# Patient Record
Sex: Male | Born: 1965 | Hispanic: No | Marital: Married | Smoking: Never smoker
Health system: Southern US, Community
[De-identification: ages and names within clinical notes are randomized; demographics above are authoritative.]

## PROBLEM LIST (undated history)

## (undated) DIAGNOSIS — E0500 Thyrotoxicosis with diffuse goiter without thyrotoxic crisis or storm: Secondary | ICD-10-CM

## (undated) HISTORY — PX: GALLBLADDER SURGERY: SHX652

## (undated) HISTORY — PX: TONSILLECTOMY: SUR1361

---

## 2004-05-09 ENCOUNTER — Inpatient Hospital Stay (HOSPITAL_COMMUNITY): Admission: EM | Admit: 2004-05-09 | Discharge: 2004-05-13 | Payer: Self-pay | Admitting: Emergency Medicine
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
## 2016-04-21 ENCOUNTER — Emergency Department (HOSPITAL_COMMUNITY)
Admission: EM | Admit: 2016-04-21 | Discharge: 2016-04-21 | Disposition: A | Discharge status: Home / Self Care | Payer: 59 | Attending: Emergency Medicine | Admitting: Emergency Medicine

## 2016-04-21 ENCOUNTER — Encounter (HOSPITAL_COMMUNITY): Payer: Self-pay

## 2016-04-21 DIAGNOSIS — S0031XA Abrasion of nose, initial encounter: Secondary | ICD-10-CM | POA: Diagnosis not present

## 2016-04-21 DIAGNOSIS — Z23 Encounter for immunization: Secondary | ICD-10-CM | POA: Diagnosis not present

## 2016-04-21 DIAGNOSIS — Y9239 Other specified sports and athletic area as the place of occurrence of the external cause: Secondary | ICD-10-CM | POA: Insufficient documentation

## 2016-04-21 DIAGNOSIS — Y999 Unspecified external cause status: Secondary | ICD-10-CM | POA: Insufficient documentation

## 2016-04-21 DIAGNOSIS — Y939 Activity, unspecified: Secondary | ICD-10-CM | POA: Diagnosis not present

## 2016-04-21 DIAGNOSIS — W228XXA Striking against or struck by other objects, initial encounter: Secondary | ICD-10-CM | POA: Insufficient documentation

## 2016-04-21 DIAGNOSIS — R55 Syncope and collapse: Secondary | ICD-10-CM | POA: Diagnosis not present

## 2016-04-21 DIAGNOSIS — S0081XA Abrasion of other part of head, initial encounter: Secondary | ICD-10-CM | POA: Diagnosis present

## 2016-04-21 HISTORY — DX: Thyrotoxicosis with diffuse goiter without thyrotoxic crisis or storm: E05.00

## 2016-04-21 LAB — BASIC METABOLIC PANEL
Anion gap: 5 (ref 5–15)
BUN: 18 mg/dL (ref 6–20)
CO2: 27 mmol/L (ref 22–32)
Calcium: 9.1 mg/dL (ref 8.9–10.3)
Chloride: 110 mmol/L (ref 101–111)
Creatinine, Ser: 1.3 mg/dL — ABNORMAL HIGH (ref 0.61–1.24)
GFR calc Af Amer: >60 mL/min (ref >60)
GFR calc non Af Amer: >60 mL/min (ref >60)
Glucose, Bld: 97 mg/dL (ref 65–99)
Potassium: 3.8 mmol/L (ref 3.5–5.1)
Sodium: 142 mmol/L (ref 135–145)

## 2016-04-21 LAB — CBC
HCT: 45.1 % (ref 39.0–52.0)
Hemoglobin: 16.1 g/dL (ref 13.0–17.0)
MCH: 29.6 pg (ref 26.0–34.0)
MCHC: 35.7 g/dL (ref 30.0–36.0)
MCV: 82.9 fL (ref 78.0–100.0)
Platelets: 215 10*3/uL (ref 150–400)
RBC: 5.44 MIL/uL (ref 4.22–5.81)
RDW: 12.3 % (ref 11.5–15.5)
WBC: 21.4 10*3/uL — ABNORMAL HIGH (ref 4.0–10.5)

## 2016-04-21 LAB — URINALYSIS, ROUTINE W REFLEX MICROSCOPIC
Bilirubin Urine: NEGATIVE
Glucose, UA: NEGATIVE mg/dL
Hgb urine dipstick: NEGATIVE
Ketones, ur: 20 mg/dL — AB
Leukocytes, UA: NEGATIVE
Nitrite: NEGATIVE
Protein, ur: NEGATIVE mg/dL
Specific Gravity, Urine: 1.014 (ref 1.005–1.030)
pH: 5 (ref 5.0–8.0)

## 2016-04-21 LAB — CBG MONITORING, ED: Glucose-Capillary: 95 mg/dL (ref 65–99)

## 2016-04-21 MED ORDER — SODIUM CHLORIDE 0.9 % IV BOLUS (SEPSIS)
1000.0000 mL | Freq: Once | INTRAVENOUS | Status: AC
  Administered 2016-04-21: 1000 mL via INTRAVENOUS

## 2016-04-21 MED ORDER — TETANUS-DIPHTH-ACELL PERTUSSIS 5-2.5-18.5 LF-MCG/0.5 IM SUSP
0.5000 mL | Freq: Once | INTRAMUSCULAR | Status: AC
  Administered 2016-04-21: 0.5 mL via INTRAMUSCULAR
  Filled 2016-04-21: qty 0.5
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
## 2016-04-21 NOTE — ED Triage Notes (Signed)
Patient states during a circuit training at the gym patient felt light headed and dizzy.  Patient sat on the stationary bike to rest.  Patient then "passed out".  Patient denies experiencing syncope prior to this event.  Patient states not drinking enough fluids.  Patient states facial pain from the fall 2/10.
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
